# Patient Record
Sex: Female | Born: 1965 | Race: White | Hispanic: No | Marital: Married | Smoking: Current every day smoker
Health system: Southern US, Community
[De-identification: ages and names within clinical notes are randomized; demographics above are authoritative.]

## PROBLEM LIST (undated history)

## (undated) DIAGNOSIS — K219 Gastro-esophageal reflux disease without esophagitis: Secondary | ICD-10-CM

## (undated) DIAGNOSIS — F32A Depression, unspecified: Secondary | ICD-10-CM

## (undated) DIAGNOSIS — F329 Major depressive disorder, single episode, unspecified: Secondary | ICD-10-CM

## (undated) DIAGNOSIS — F988 Other specified behavioral and emotional disorders with onset usually occurring in childhood and adolescence: Secondary | ICD-10-CM

## (undated) DIAGNOSIS — A048 Other specified bacterial intestinal infections: Secondary | ICD-10-CM

## (undated) HISTORY — PX: CHOLECYSTECTOMY: SHX55

## (undated) HISTORY — PX: ABDOMINAL HYSTERECTOMY: SHX81

## (undated) HISTORY — PX: AUGMENTATION MAMMAPLASTY: SUR837

---

## 2008-07-27 ENCOUNTER — Emergency Department (HOSPITAL_BASED_OUTPATIENT_CLINIC_OR_DEPARTMENT_OTHER): Admission: EM | Admit: 2008-07-27 | Discharge: 2008-07-27 | Payer: Self-pay | Admitting: Emergency Medicine

## 2008-07-27 ENCOUNTER — Ambulatory Visit: Payer: Self-pay | Admitting: Diagnostic Radiology

## 2008-09-15 ENCOUNTER — Ambulatory Visit (HOSPITAL_BASED_OUTPATIENT_CLINIC_OR_DEPARTMENT_OTHER): Admission: RE | Admit: 2008-09-15 | Discharge: 2008-09-15 | Payer: Self-pay | Admitting: Orthopedic Surgery

## 2008-09-15 ENCOUNTER — Ambulatory Visit: Payer: Self-pay | Admitting: Diagnostic Radiology

## 2008-10-06 ENCOUNTER — Ambulatory Visit (HOSPITAL_BASED_OUTPATIENT_CLINIC_OR_DEPARTMENT_OTHER): Admission: RE | Admit: 2008-10-06 | Discharge: 2008-10-06 | Payer: Self-pay | Admitting: Orthopedic Surgery

## 2008-10-06 ENCOUNTER — Ambulatory Visit: Payer: Self-pay | Admitting: Diagnostic Radiology

## 2008-10-14 ENCOUNTER — Encounter: Admission: RE | Admit: 2008-10-14 | Discharge: 2009-01-12 | Payer: Self-pay | Admitting: Orthopedic Surgery

## 2009-01-28 ENCOUNTER — Encounter: Admission: RE | Admit: 2009-01-28 | Discharge: 2009-03-16 | Payer: Self-pay | Admitting: Orthopedic Surgery

## 2012-03-14 ENCOUNTER — Emergency Department (HOSPITAL_BASED_OUTPATIENT_CLINIC_OR_DEPARTMENT_OTHER): Payer: BC Managed Care – PPO

## 2012-03-14 ENCOUNTER — Emergency Department (HOSPITAL_BASED_OUTPATIENT_CLINIC_OR_DEPARTMENT_OTHER)
Admission: EM | Admit: 2012-03-14 | Discharge: 2012-03-14 | Disposition: A | Discharge status: Home / Self Care | Payer: BC Managed Care – PPO | Attending: Emergency Medicine | Admitting: Emergency Medicine

## 2012-03-14 ENCOUNTER — Encounter (HOSPITAL_BASED_OUTPATIENT_CLINIC_OR_DEPARTMENT_OTHER): Payer: Self-pay | Admitting: *Deleted

## 2012-03-14 DIAGNOSIS — Z79899 Other long term (current) drug therapy: Secondary | ICD-10-CM | POA: Insufficient documentation

## 2012-03-14 DIAGNOSIS — F329 Major depressive disorder, single episode, unspecified: Secondary | ICD-10-CM | POA: Insufficient documentation

## 2012-03-14 DIAGNOSIS — F3289 Other specified depressive episodes: Secondary | ICD-10-CM | POA: Insufficient documentation

## 2012-03-14 DIAGNOSIS — R110 Nausea: Secondary | ICD-10-CM | POA: Insufficient documentation

## 2012-03-14 DIAGNOSIS — F988 Other specified behavioral and emotional disorders with onset usually occurring in childhood and adolescence: Secondary | ICD-10-CM | POA: Insufficient documentation

## 2012-03-14 DIAGNOSIS — Z87448 Personal history of other diseases of urinary system: Secondary | ICD-10-CM | POA: Insufficient documentation

## 2012-03-14 DIAGNOSIS — F17200 Nicotine dependence, unspecified, uncomplicated: Secondary | ICD-10-CM | POA: Insufficient documentation

## 2012-03-14 DIAGNOSIS — K219 Gastro-esophageal reflux disease without esophagitis: Secondary | ICD-10-CM | POA: Insufficient documentation

## 2012-03-14 DIAGNOSIS — K5900 Constipation, unspecified: Secondary | ICD-10-CM | POA: Insufficient documentation

## 2012-03-14 DIAGNOSIS — M549 Dorsalgia, unspecified: Secondary | ICD-10-CM | POA: Insufficient documentation

## 2012-03-14 HISTORY — DX: Major depressive disorder, single episode, unspecified: F32.9

## 2012-03-14 HISTORY — DX: Other specified bacterial intestinal infections: A04.8

## 2012-03-14 HISTORY — DX: Other specified behavioral and emotional disorders with onset usually occurring in childhood and adolescence: F98.8

## 2012-03-14 HISTORY — DX: Depression, unspecified: F32.A

## 2012-03-14 HISTORY — DX: Gastro-esophageal reflux disease without esophagitis: K21.9

## 2012-03-14 LAB — CBC WITH DIFFERENTIAL/PLATELET
Basophils Absolute: 0 10*3/uL (ref 0.0–0.1)
HCT: 41.5 % (ref 36.0–46.0)
Hemoglobin: 14.3 g/dL (ref 12.0–15.0)
Lymphocytes Relative: 40 % (ref 12–46)
Monocytes Absolute: 0.6 10*3/uL (ref 0.1–1.0)
Monocytes Relative: 8 % (ref 3–12)
Neutro Abs: 4.2 10*3/uL (ref 1.7–7.7)
Neutrophils Relative %: 52 % (ref 43–77)
WBC: 8.1 10*3/uL (ref 4.0–10.5)

## 2012-03-14 LAB — COMPREHENSIVE METABOLIC PANEL
BUN: 7 mg/dL (ref 6–23)
Calcium: 9.5 mg/dL (ref 8.4–10.5)
Creatinine, Ser: 0.6 mg/dL (ref 0.50–1.10)
GFR calc Af Amer: >90 mL/min (ref >90)
GFR calc non Af Amer: >90 mL/min (ref >90)
Glucose, Bld: 85 mg/dL (ref 70–99)
Total Protein: 7.2 g/dL (ref 6.0–8.3)

## 2012-03-14 LAB — URINALYSIS, ROUTINE W REFLEX MICROSCOPIC
Bilirubin Urine: NEGATIVE
Hgb urine dipstick: NEGATIVE
Ketones, ur: NEGATIVE mg/dL
Protein, ur: NEGATIVE mg/dL
Urobilinogen, UA: 0.2 mg/dL (ref 0.0–1.0)

## 2012-03-14 MED ORDER — HYDROCODONE-ACETAMINOPHEN 5-325 MG PO TABS: 2.0000 | ORAL_TABLET | ORAL | Status: AC | PRN

## 2012-03-14 MED ORDER — MORPHINE SULFATE 4 MG/ML IJ SOLN
4.0000 mg | Freq: Once | INTRAMUSCULAR | Status: AC
  Administered 2012-03-14: 4 mg via INTRAVENOUS
  Filled 2012-03-14: qty 1

## 2012-03-14 MED ORDER — ONDANSETRON HCL 4 MG/2ML IJ SOLN
4.0000 mg | Freq: Once | INTRAMUSCULAR | Status: AC
  Administered 2012-03-14: 4 mg via INTRAVENOUS
  Filled 2012-03-14: qty 2

## 2012-03-14 MED ORDER — IOHEXOL 300 MG/ML  SOLN
25.0000 mL | INTRAMUSCULAR | Status: AC
  Administered 2012-03-14 (×2): 25 mL via ORAL

## 2012-03-14 MED ORDER — IOHEXOL 300 MG/ML  SOLN
100.0000 mL | Freq: Once | INTRAMUSCULAR | Status: AC | PRN
  Administered 2012-03-14: 100 mL via INTRAVENOUS

## 2012-03-14 NOTE — ED Notes (Signed)
Pt. Drinking contrast and reports he abd. Is starting to hurt again.  Pt. Was having pain relief.

## 2012-03-14 NOTE — ED Provider Notes (Signed)
History     CSN: [id number]  Arrival date & time 03/14/12  1339   First MD Initiated Contact with Patient 03/14/12 1411      Chief Complaint  Patient presents with  . Abdominal Pain    (Consider location/radiation/quality/duration/timing/severity/associated sxs/prior treatment) HPI Comments: Pt c/o generalized lower abdominal pain:no fever:denies history of similar symptoms  Patient is a 46 y.o. female presenting with abdominal pain. The history is provided by the patient. No language interpreter was used.  Abdominal Pain The primary symptoms of the illness include abdominal pain and nausea. The primary symptoms of the illness do not include vomiting, dysuria or vaginal discharge. The current episode started more than 2 days ago. The onset of the illness was gradual. The problem has not changed since onset. The patient states that she believes she is currently not pregnant. The patient has not had a change in bowel habit. Additional symptoms associated with the illness include back pain. Symptoms associated with the illness do not include chills.    Past Medical History  Diagnosis Date  . Depression   . ADD (attention deficit disorder)   . GERD (gastroesophageal reflux disease)   . H. pylori infection     Past Surgical History  Procedure Date  . Abdominal hysterectomy   . Cholecystectomy     No family history on file.  History  Substance Use Topics  . Smoking status: Current Every Day Smoker -- 0.5 packs/day    Types: Cigarettes  . Smokeless tobacco: Not on file  . Alcohol Use: Yes    OB History    Grav Para Term Preterm Abortions TAB SAB Ect Mult Living                  Review of Systems  Constitutional: Negative for chills.  Respiratory: Negative.   Cardiovascular: Negative.   Gastrointestinal: Positive for nausea and abdominal pain. Negative for vomiting.  Genitourinary: Negative for dysuria and vaginal discharge.  Musculoskeletal: Positive for back  pain.    Allergies  Review of patient's allergies indicates no known allergies.  Home Medications   Current Outpatient Rx  Name  Route  Sig  Dispense  Refill  . XANAX PO   Oral   Take by mouth.         . AMPHETAMINE-DEXTROAMPHETAMINE 10 MG PO TABS   Oral   Take 10 mg by mouth daily.         Marland Kitchen FLUOXETINE HCL 40 MG PO CAPS   Oral   Take 40 mg by mouth daily.         . AMBIEN PO   Oral   Take by mouth.           BP 138/82  Pulse 80  Temp 98.2 F (36.8 C) (Oral)  Resp 22  SpO2 100%  Physical Exam  Nursing note and vitals reviewed. Constitutional: She appears well-developed and well-nourished.  Cardiovascular: Normal rate and regular rhythm.   Pulmonary/Chest: Effort normal and breath sounds normal.  Abdominal: Soft. Bowel sounds are normal. There is tenderness in the left lower quadrant.  Musculoskeletal: Normal range of motion.  Neurological: She is alert.  Skin: Skin is warm and dry.  Psychiatric: She has a normal mood and affect.    ED Course  Procedures (including critical care time)   Labs Reviewed  URINALYSIS, ROUTINE W REFLEX MICROSCOPIC  COMPREHENSIVE METABOLIC PANEL  CBC WITH DIFFERENTIAL  LIPASE, BLOOD   Ct Abdomen Pelvis W Contrast  03/14/2012  *RADIOLOGY  REPORT*  Clinical Data: Abdominal pain.  Nausea.  CT ABDOMEN AND PELVIS WITH CONTRAST  Technique:  Multidetector CT imaging of the abdomen and pelvis was performed following the standard protocol during bolus administration of intravenous contrast.  Contrast: OMNIPAQUE IOHEXOL 300 MG/ML  SOLN  Comparison: None.  Findings: Linear subsegmental atelectasis or scarring noted medially in the right middle lobe.  The common bile duct measures 8 mm in diameter, likely physiologic response to cholecystectomy.  The common hepatic duct measures about 1.3 cm and there is mild intrahepatic bile duct dilatation near the liver hilum and in the left hepatic lobe.  I do not observe a mass in this  vicinity. The pancreas, spleen, and adrenal glands appear normal. No pathologic retroperitoneal or porta hepatis adenopathy is identified.  Prominence of stool throughout the colon suggests constipation.  6 mm hypodense lesion in the left mid kidney is technically too small to characterize although statistically likely to represent a cyst.  Urinary bladder unremarkable. No pathologic pelvic adenopathy is identified.  Right ovarian follicles unremarkable.  Left ovary is somewhat anterior in position but overall unremarkable.  Uterus absent.  There is mild spurring of the femoral heads.  IMPRESSION:  1.  Mild common hepatic duct and intrahepatic duct biliary dilatation is probably a physiologic response cholecystectomy, with an obstructive process considered unlikely. 2.  Small left renal cyst. 3. Prominence of stool throughout the colon suggests constipation.   Original Report Authenticated By: Gaylyn Rong, M.D.      1. Constipation       MDM  Pt in no acute distress at the time:abdomen not acute:pt given instructions on helping with constipation        Teressa Lower, NP 03/14/12 1641

## 2012-03-14 NOTE — ED Notes (Signed)
Pt. Gone at  Present time to CT scan.

## 2012-03-14 NOTE — ED Notes (Signed)
Pt. Up to restroom with no distress noted.  Pt. Will be receiving more pain meds due to c/o abd. Pain and then to CT scan.  Pt. Is alert and oriented.

## 2012-03-14 NOTE — ED Notes (Signed)
Lower abdominal pain with nausea. Denies urinary problems, vomiting or diarrhea.

## 2012-03-15 NOTE — ED Provider Notes (Signed)
Medical screening examination/treatment/procedure(s) were performed by non-physician practitioner and as supervising physician I was immediately available for consultation/collaboration.   Gwyneth Sprout, MD 03/15/12 [phone]

## 2014-01-19 IMAGING — CT CT ABD-PELV W/ CM
2 of 5 series · 16 of 46 positions shown, 18 images · IV contrast (APPLIED)
Comparison: None.

CLINICAL DATA: Abdominal pain.  Nausea.

CT ABDOMEN AND PELVIS WITH CONTRAST
TECHNIQUE: Multidetector CT imaging of the abdomen and pelvis was
performed following the standard protocol during bolus
administration of intravenous contrast.
Contrast: 100mL OMNIPAQUE IOHEXOL 300 MG/ML  SOLN

[Series 2: abd/pelvis 5.0 b31f · axial · 0.68mm/px · z∈[-466,-101]mm · 13 of 83 slices shown, 15 images]
[im 5/83  soft-tissue]
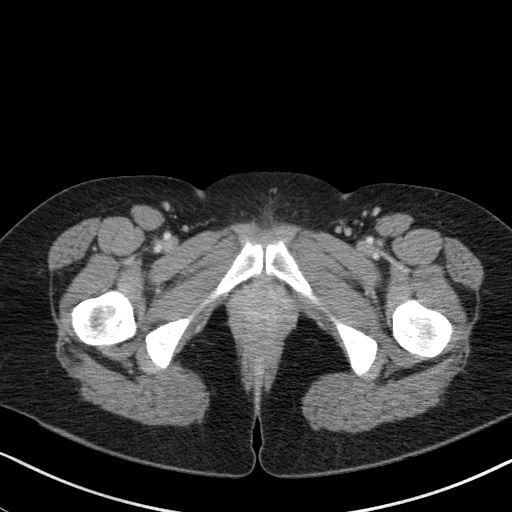
[im 5/83  bone]
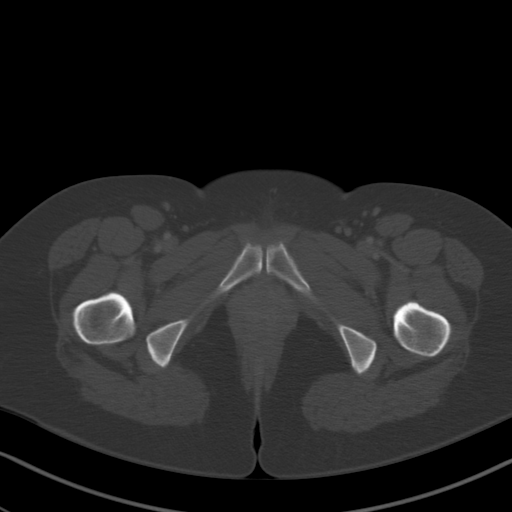
[im 10/83  soft-tissue]
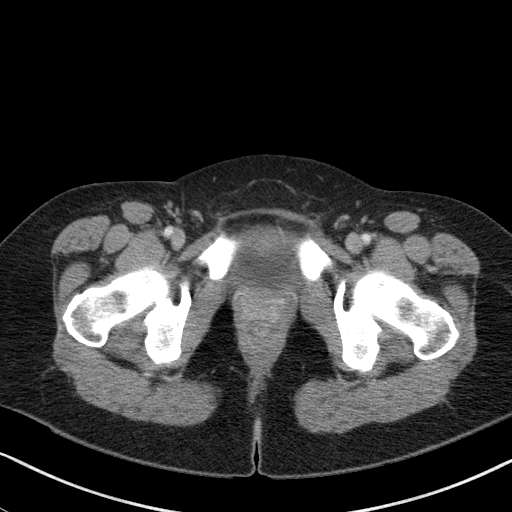
[im 19/83  soft-tissue]
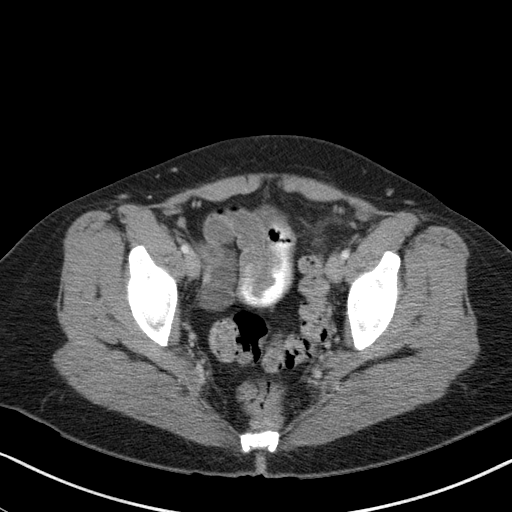
[im 23/83  soft-tissue]
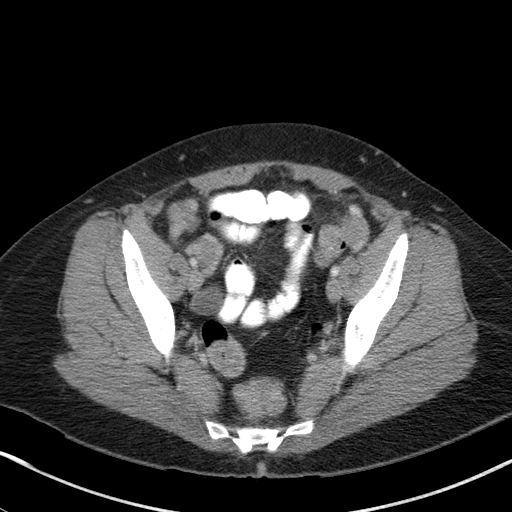
[im 28/83  soft-tissue]
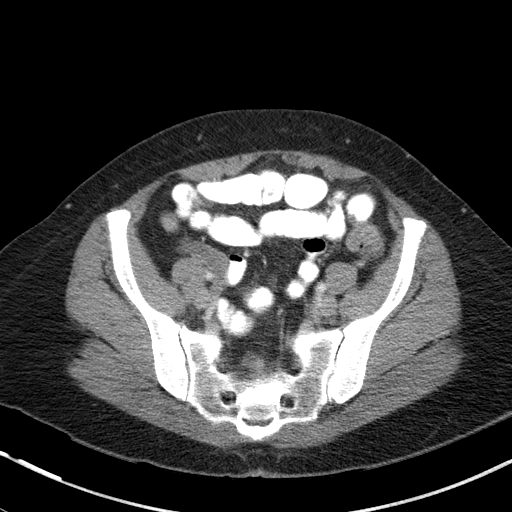
[im 37/83  soft-tissue]
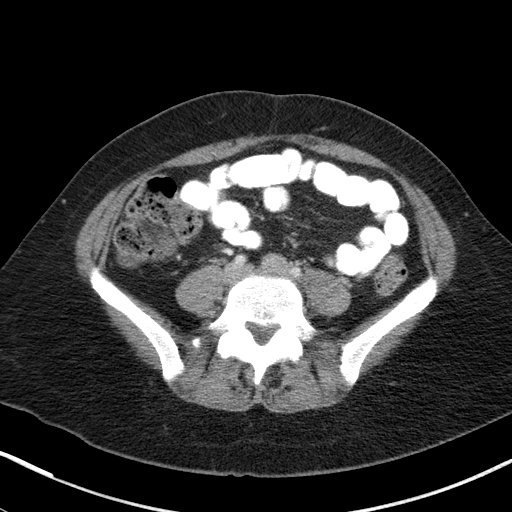
[im 42/83  soft-tissue]
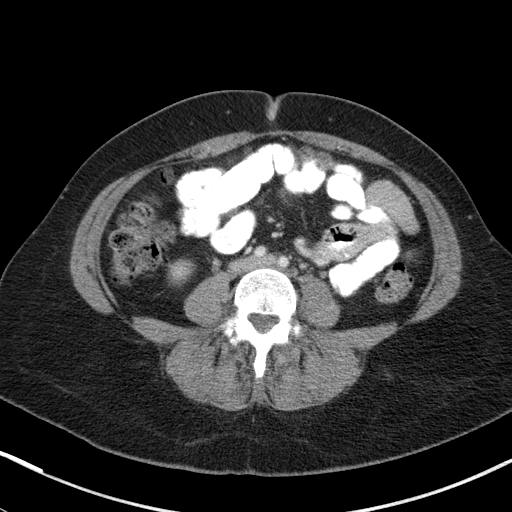
[im 46/83  soft-tissue]
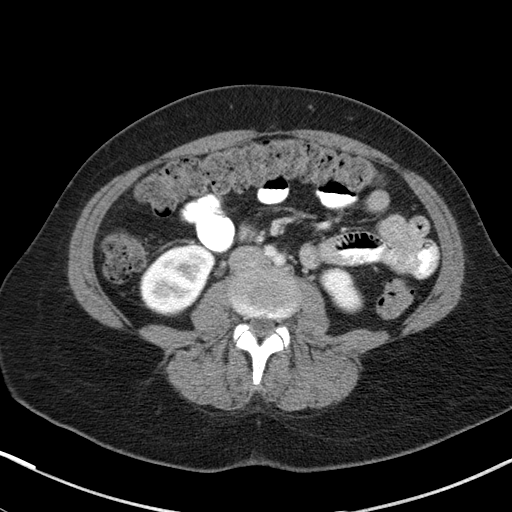
[im 55/83  soft-tissue]
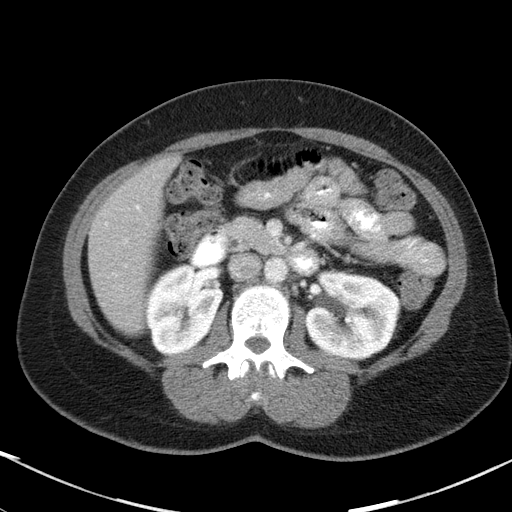
[im 55/83  bone]
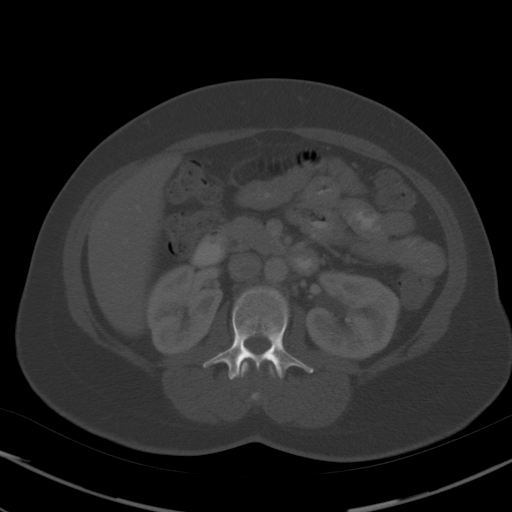
[im 60/83  soft-tissue]
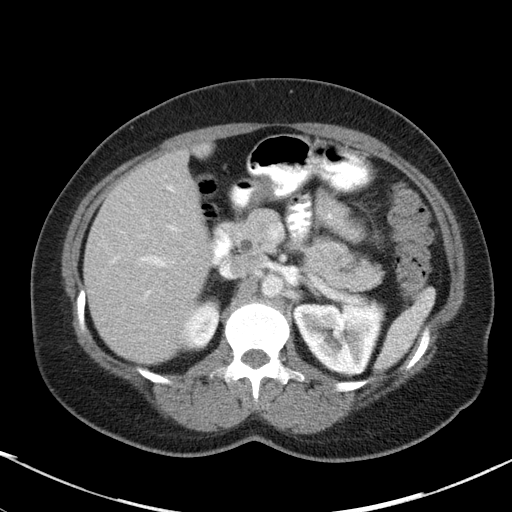
[im 64/83  soft-tissue]
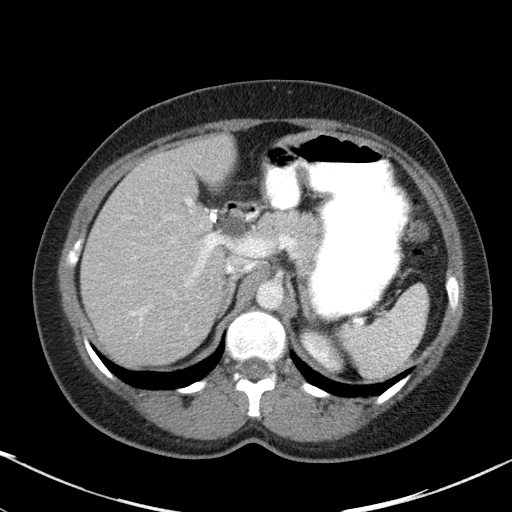
[im 73/83  soft-tissue]
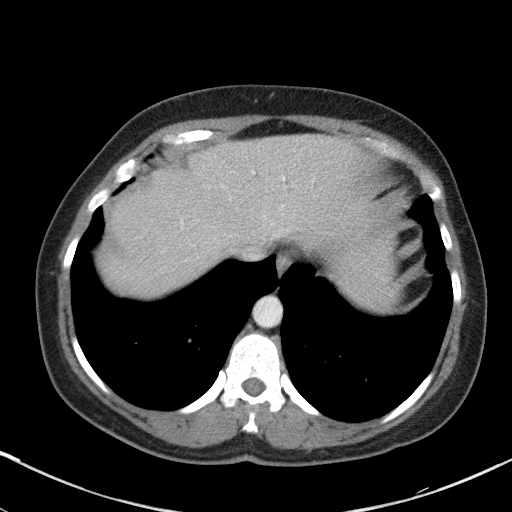
[im 78/83  soft-tissue]
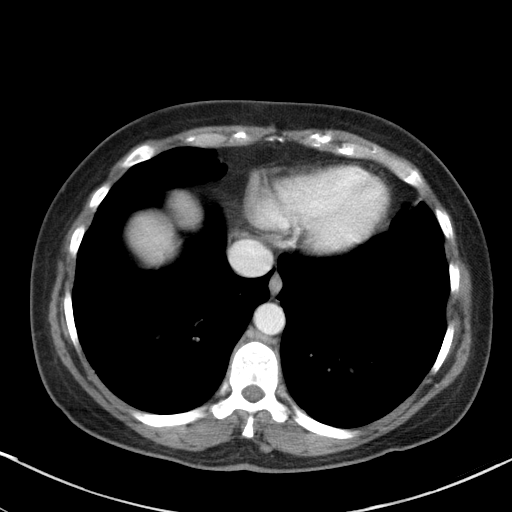

[Series 5: abd/pelvis 3.0 coronal · coronal · 0.74mm/px · 3 of 76 slices shown]
[im 26/76  soft-tissue]
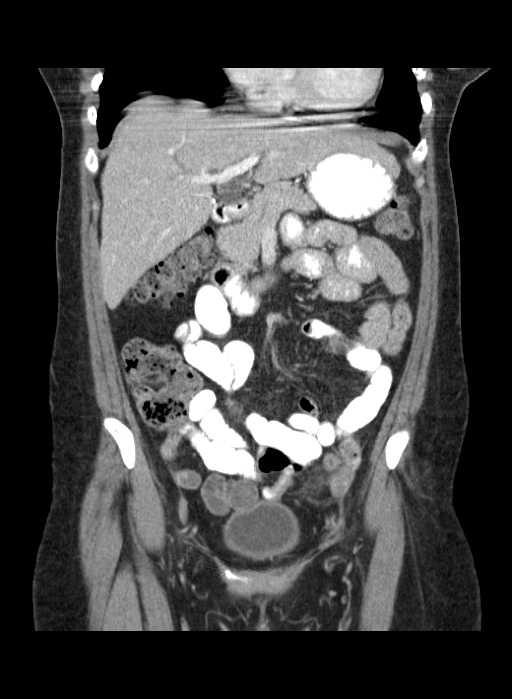
[im 34/76  soft-tissue]
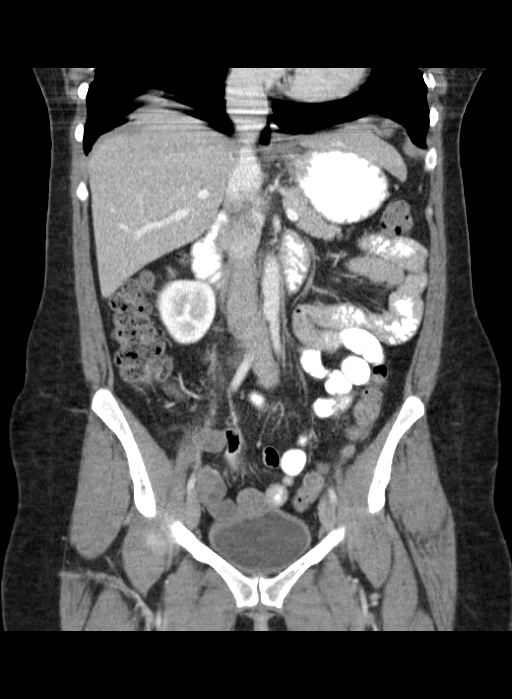
[im 42/76  soft-tissue]
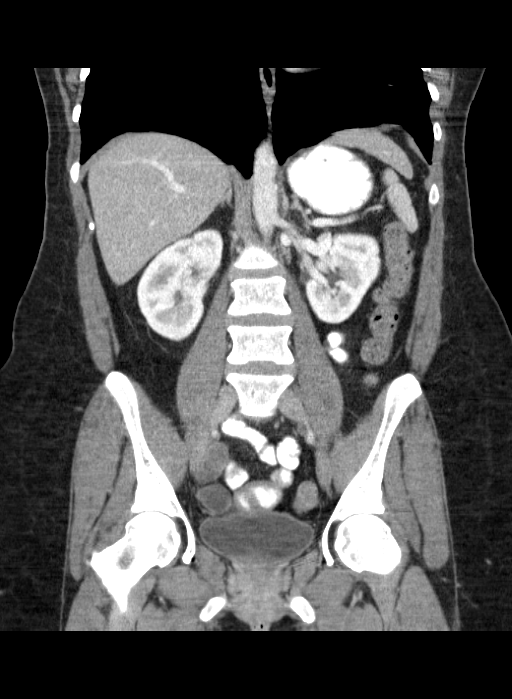

[16 of 46 positions shown; findings below may reference images not displayed]

FINDINGS: Linear subsegmental atelectasis or scarring noted
medially in the right middle lobe.

The common bile duct measures 8 mm in diameter, likely physiologic
response to cholecystectomy.  The common hepatic duct measures
about 1.3 cm and there is mild intrahepatic bile duct dilatation
near the liver hilum and in the left hepatic lobe.  I do not
observe a mass in this vicinity. The pancreas, spleen, and adrenal
glands appear normal. No pathologic retroperitoneal or porta
hepatis adenopathy is identified.

Prominence of stool throughout the colon suggests constipation.  6
mm hypodense lesion in the left mid kidney is technically too small
to characterize although statistically likely to represent a cyst.

Urinary bladder unremarkable. No pathologic pelvic adenopathy is
identified.

Right ovarian follicles unremarkable.  Left ovary is somewhat
anterior in position but overall unremarkable.  Uterus absent.

There is mild spurring of the femoral heads.
IMPRESSION: 1.  Mild common hepatic duct and intrahepatic duct biliary
dilatation is probably a physiologic response cholecystectomy, with
an obstructive process considered unlikely.
2.  Small left renal cyst.
3. Prominence of stool throughout the colon suggests constipation.

## 2017-04-09 ENCOUNTER — Other Ambulatory Visit (HOSPITAL_BASED_OUTPATIENT_CLINIC_OR_DEPARTMENT_OTHER): Payer: Self-pay | Admitting: Physician Assistant

## 2017-04-09 DIAGNOSIS — Z1231 Encounter for screening mammogram for malignant neoplasm of breast: Secondary | ICD-10-CM

## 2017-04-16 ENCOUNTER — Encounter (HOSPITAL_BASED_OUTPATIENT_CLINIC_OR_DEPARTMENT_OTHER): Payer: Self-pay

## 2017-04-16 ENCOUNTER — Other Ambulatory Visit (HOSPITAL_BASED_OUTPATIENT_CLINIC_OR_DEPARTMENT_OTHER): Payer: Self-pay | Admitting: Physician Assistant

## 2017-04-16 ENCOUNTER — Ambulatory Visit (HOSPITAL_BASED_OUTPATIENT_CLINIC_OR_DEPARTMENT_OTHER)
Admission: RE | Admit: 2017-04-16 | Discharge: 2017-04-16 | Disposition: A | Discharge status: Home / Self Care | Payer: BLUE CROSS/BLUE SHIELD | Source: Ambulatory Visit | Attending: Physician Assistant | Admitting: Physician Assistant

## 2017-04-16 DIAGNOSIS — Z1231 Encounter for screening mammogram for malignant neoplasm of breast: Secondary | ICD-10-CM | POA: Insufficient documentation

## 2017-04-18 ENCOUNTER — Other Ambulatory Visit: Payer: Self-pay | Admitting: Physician Assistant

## 2017-04-18 DIAGNOSIS — R928 Other abnormal and inconclusive findings on diagnostic imaging of breast: Secondary | ICD-10-CM

## 2017-06-04 ENCOUNTER — Other Ambulatory Visit: Payer: BLUE CROSS/BLUE SHIELD

## 2017-12-27 ENCOUNTER — Other Ambulatory Visit (HOSPITAL_BASED_OUTPATIENT_CLINIC_OR_DEPARTMENT_OTHER): Payer: Self-pay | Admitting: Emergency Medicine

## 2017-12-27 DIAGNOSIS — R109 Unspecified abdominal pain: Secondary | ICD-10-CM

## 2017-12-29 ENCOUNTER — Ambulatory Visit (HOSPITAL_BASED_OUTPATIENT_CLINIC_OR_DEPARTMENT_OTHER)
Admission: RE | Admit: 2017-12-29 | Discharge: 2017-12-29 | Disposition: A | Discharge status: Home / Self Care | Payer: BLUE CROSS/BLUE SHIELD | Source: Ambulatory Visit | Attending: Emergency Medicine | Admitting: Emergency Medicine

## 2017-12-29 DIAGNOSIS — Z9889 Other specified postprocedural states: Secondary | ICD-10-CM | POA: Insufficient documentation

## 2017-12-29 DIAGNOSIS — Z9049 Acquired absence of other specified parts of digestive tract: Secondary | ICD-10-CM | POA: Diagnosis not present

## 2017-12-29 DIAGNOSIS — R1030 Lower abdominal pain, unspecified: Secondary | ICD-10-CM | POA: Insufficient documentation

## 2017-12-29 DIAGNOSIS — R109 Unspecified abdominal pain: Secondary | ICD-10-CM | POA: Diagnosis not present

## 2017-12-31 ENCOUNTER — Ambulatory Visit (HOSPITAL_BASED_OUTPATIENT_CLINIC_OR_DEPARTMENT_OTHER): Payer: BLUE CROSS/BLUE SHIELD

## 2018-04-04 ENCOUNTER — Encounter (HOSPITAL_BASED_OUTPATIENT_CLINIC_OR_DEPARTMENT_OTHER): Payer: Self-pay | Admitting: *Deleted

## 2018-04-04 ENCOUNTER — Other Ambulatory Visit: Payer: Self-pay

## 2018-04-04 ENCOUNTER — Emergency Department (HOSPITAL_BASED_OUTPATIENT_CLINIC_OR_DEPARTMENT_OTHER)
Admission: EM | Admit: 2018-04-04 | Discharge: 2018-04-04 | Disposition: A | Discharge status: Home / Self Care | Payer: BLUE CROSS/BLUE SHIELD | Attending: Emergency Medicine | Admitting: Emergency Medicine

## 2018-04-04 DIAGNOSIS — Z9049 Acquired absence of other specified parts of digestive tract: Secondary | ICD-10-CM | POA: Diagnosis not present

## 2018-04-04 DIAGNOSIS — D696 Thrombocytopenia, unspecified: Secondary | ICD-10-CM | POA: Diagnosis present

## 2018-04-04 DIAGNOSIS — Z0189 Encounter for other specified special examinations: Secondary | ICD-10-CM | POA: Insufficient documentation

## 2018-04-04 DIAGNOSIS — F329 Major depressive disorder, single episode, unspecified: Secondary | ICD-10-CM | POA: Diagnosis not present

## 2018-04-04 DIAGNOSIS — Z79899 Other long term (current) drug therapy: Secondary | ICD-10-CM | POA: Insufficient documentation

## 2018-04-04 DIAGNOSIS — F17210 Nicotine dependence, cigarettes, uncomplicated: Secondary | ICD-10-CM | POA: Insufficient documentation

## 2018-04-04 DIAGNOSIS — F909 Attention-deficit hyperactivity disorder, unspecified type: Secondary | ICD-10-CM | POA: Insufficient documentation

## 2018-04-04 LAB — CBC WITH DIFFERENTIAL/PLATELET
Abs Immature Granulocytes: 0.02 10*3/uL (ref 0.00–0.07)
Basophils Absolute: 0 10*3/uL (ref 0.0–0.1)
Basophils Relative: 0 %
EOS ABS: 0.1 10*3/uL (ref 0.0–0.5)
Eosinophils Relative: 1 %
HCT: 40.8 % (ref 36.0–46.0)
Hemoglobin: 12.9 g/dL (ref 12.0–15.0)
IMMATURE GRANULOCYTES: 0 %
LYMPHS ABS: 4.5 10*3/uL — AB (ref 0.7–4.0)
Lymphocytes Relative: 46 %
MCH: 27.7 pg (ref 26.0–34.0)
MCHC: 31.6 g/dL (ref 30.0–36.0)
MCV: 87.7 fL (ref 80.0–100.0)
Monocytes Absolute: 0.8 10*3/uL (ref 0.1–1.0)
Monocytes Relative: 8 %
NEUTROS PCT: 45 %
NRBC: 0 % (ref 0.0–0.2)
Neutro Abs: 4.4 10*3/uL (ref 1.7–7.7)
PLATELETS: 228 10*3/uL (ref 150–400)
RBC: 4.65 MIL/uL (ref 3.87–5.11)
RDW: 15.4 % (ref 11.5–15.5)
WBC: 9.8 10*3/uL (ref 4.0–10.5)

## 2018-04-04 NOTE — ED Triage Notes (Signed)
Her MD called her and told her to come to the ED due to abnormal lab work. She had blood drawn 2 days ago.

## 2018-04-04 NOTE — Discharge Instructions (Addendum)
Your blood test was completely normal today.  No signs of abnormal platelet count.  Follow  up with your doctor as planned.

## 2018-04-04 NOTE — ED Provider Notes (Signed)
[facility] EMERGENCY DEPARTMENT Provider Note   CSN: [id number] Arrival date & time: 04/04/18  1556     History   Chief Complaint Chief Complaint  Patient presents with  . Abnormal Lab    HPI Jody Harper is a 53 y.o. female.  HPI Patient presents to the emergency room for evaluation of an abnormal laboratory test.  Patient states she had routine blood test at her doctor's office the other day.  Patient states she felt fine this was a routine checkup.  She has not had any issues with fevers or chills.  No bleeding.  No bruising.  She was called today and was told that her platelet count was very low, 20,000.  Patient thinks there may been some issues when the blood was drawn.  She did note that the patient had difficulty drawing her blood.  She thought the blood might have been clotted. Past Medical History:  Diagnosis Date  . ADD (attention deficit disorder)   . Depression   . GERD (gastroesophageal reflux disease)   . H. pylori infection     There are no active problems to display for this patient.   Past Surgical History:  Procedure Laterality Date  . ABDOMINAL HYSTERECTOMY    . AUGMENTATION MAMMAPLASTY Bilateral   . CHOLECYSTECTOMY       OB History   No obstetric history on file.      Home Medications    Prior to Admission medications   Medication Sig Start Date End Date Taking? Authorizing Provider  FLUoxetine (PROZAC) 40 MG capsule Take 40 mg by mouth daily.   Yes [provider]  metoprolol tartrate (LOPRESSOR) 25 MG tablet Take by mouth. 12/11/17  Yes [provider]  temazepam (RESTORIL) 30 MG capsule Take by mouth. 10/02/17  Yes [provider]  ALPRAZolam (XANAX PO) Take by mouth.    [provider]  amphetamine-dextroamphetamine (ADDERALL) 10 MG tablet Take 10 mg by mouth daily.    [provider]  dexlansoprazole (DEXILANT) 60 MG capsule Take by mouth.    [provider]    HYDROcodone-acetaminophen (NORCO/VICODIN) 5-325 MG per tablet Take 2 tablets by mouth every 4 (four) hours as needed for pain. 03/14/12   Teressa Lower, NP  lisdexamfetamine (VYVANSE) 30 MG capsule Take by mouth.    [provider]  valACYclovir (VALTREX) 500 MG tablet Take by mouth.    [provider]  Zolpidem Tartrate (AMBIEN PO) Take by mouth.    [provider]    Family History No family history on file.  Social History Social History   Tobacco Use  . Smoking status: Current Every Day Smoker    Packs/day: 0.50    Types: Cigarettes  . Smokeless tobacco: Never Used  Substance Use Topics  . Alcohol use: Not Currently  . Drug use: No     Allergies   Patient has no known allergies.   Review of Systems Review of Systems  All other systems reviewed and are negative.    Physical Exam Updated Vital Signs BP (!) 123/91   Pulse 92   Temp 98.1 F (36.7 C) (Oral)   Resp 18   Ht 1.626 m (5\' 4" )   Wt 81.6 kg   SpO2 97%   BMI 30.90 kg/m   Physical Exam Vitals signs and nursing note reviewed.  Constitutional:      General: She is not in acute distress.    Appearance: She is well-developed.  HENT:  Head: Normocephalic and atraumatic.     Right Ear: External ear normal.     Left Ear: External ear normal.  Eyes:     General: No scleral icterus.       Right eye: No discharge.        Left eye: No discharge.     Conjunctiva/sclera: Conjunctivae normal.  Neck:     Musculoskeletal: Neck supple.     Trachea: No tracheal deviation.  Cardiovascular:     Rate and Rhythm: Normal rate and regular rhythm.  Pulmonary:     Effort: Pulmonary effort is normal. No respiratory distress.     Breath sounds: Normal breath sounds. No stridor.  Abdominal:     General: There is no distension.  Musculoskeletal:        General: No swelling or deformity.  Skin:    General: Skin is warm and dry.     Findings: No rash.  Neurological:     Mental  Status: She is alert.     Cranial Nerves: Cranial nerve deficit: no gross deficits.      ED Treatments / Results  Labs (all labs ordered are listed, but only abnormal results are displayed) Labs Reviewed  CBC WITH DIFFERENTIAL/PLATELET - Abnormal; Notable for the following components:      Result Value   Lymphs Abs 4.5 (*)    All other components within normal limits    EKG None  Radiology No results found.  Procedures Procedures (including critical care time)  Medications Ordered in ED Medications - No data to display   Initial Impression / Assessment and Plan / ED Course  I have reviewed the triage vital signs and the nursing notes.  Pertinent labs & imaging results that were available during my care of the patient were reviewed by me and considered in my medical decision making (see chart for details).    Patient presented to the emergency room to have her platelet count rechecked.  She was told that it was abnormally low.  It sounds like the specimen most likely clotted.  Today's CBC is entirely normal.  Patient was reassured.  She can follow-up with her doctor routinely  Final Clinical Impressions(s) / ED Diagnoses   Final diagnoses:  Visit for blood test    ED Discharge Orders    None       Linwood Dibbles, MD 04/04/18 1701

## 2020-08-19 IMAGING — US US ABDOMEN COMPLETE
1 series · 14 of 25 positions shown · non-contrast
Comparison: CT 03/14/2012

CLINICAL DATA: Periumbilical pain for several months. Prior tummy
tuck

EXAM:
ABDOMEN ULTRASOUND COMPLETE

[Series 1: us abdomen complete · 0.20mm/px · 14 of 60 slices shown]
[im 1/60]
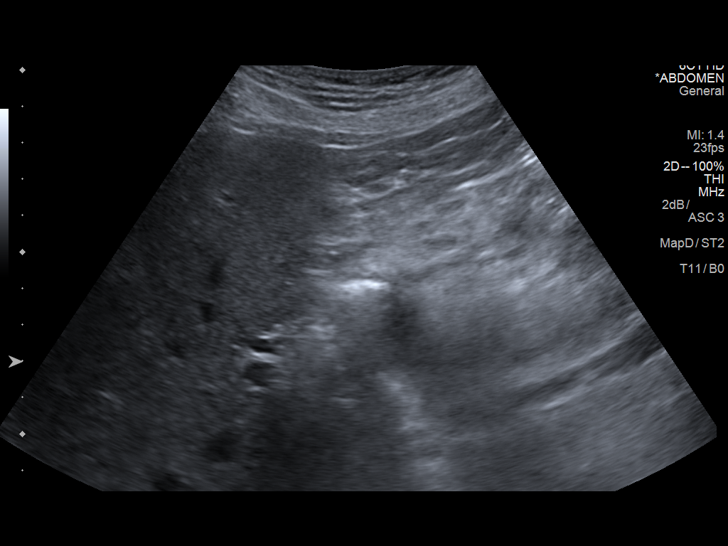
[im 5/60]
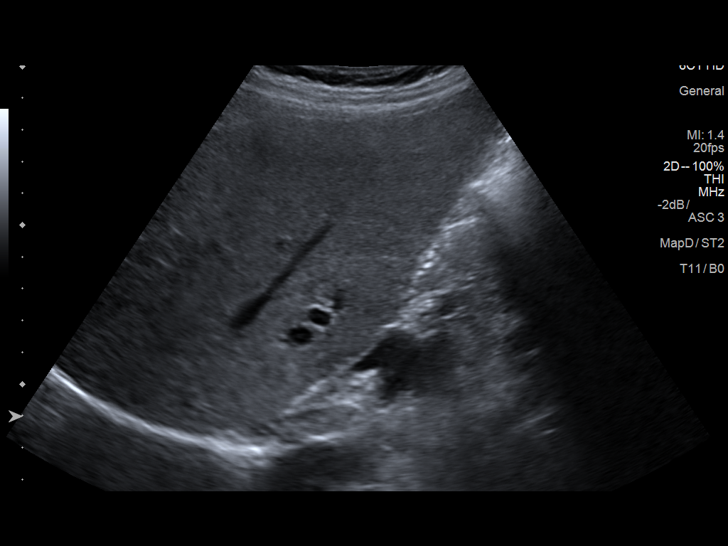
[im 10/60]
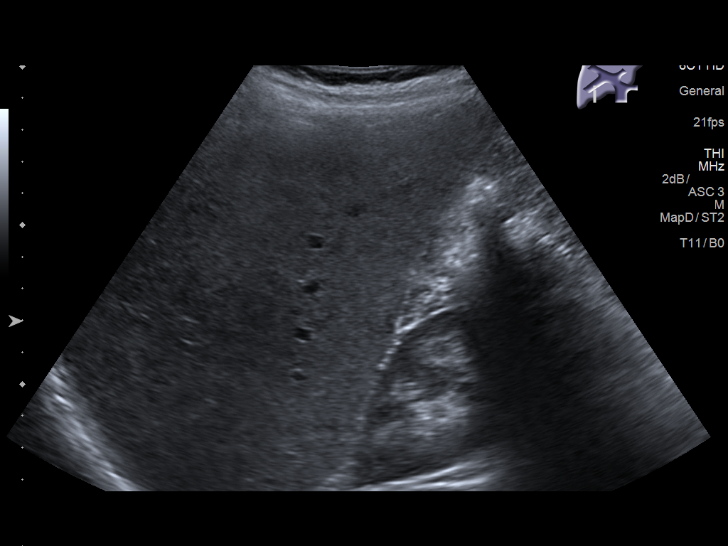
[im 15/60]
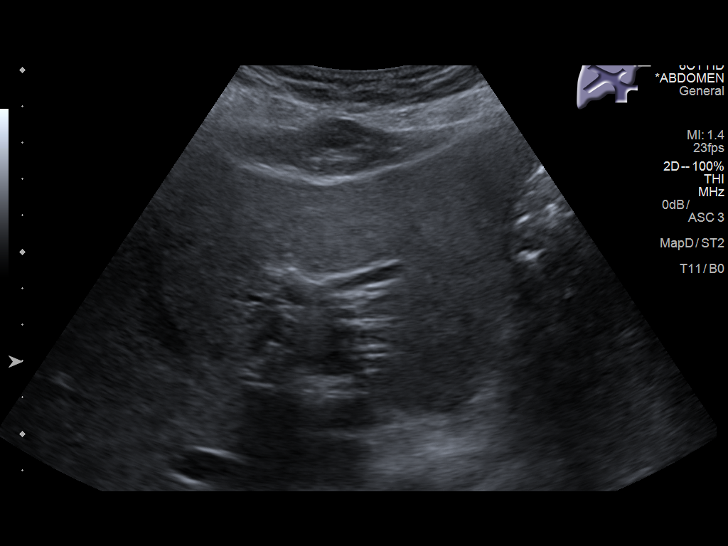
[im 20/60]
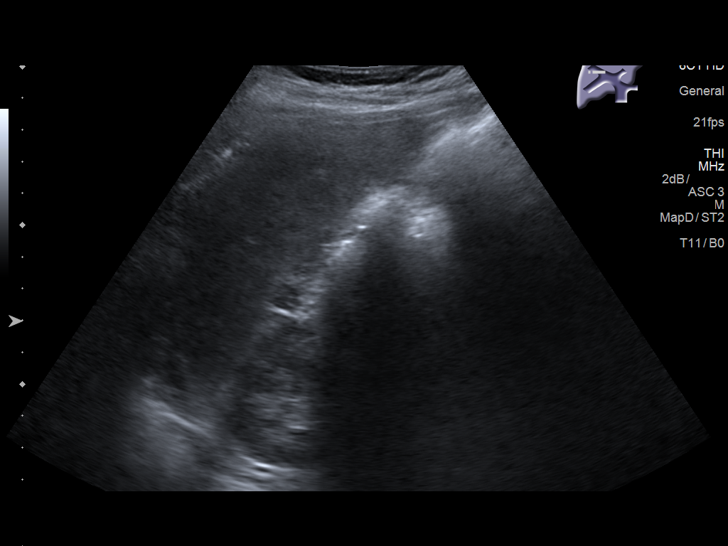
[im 23/60]
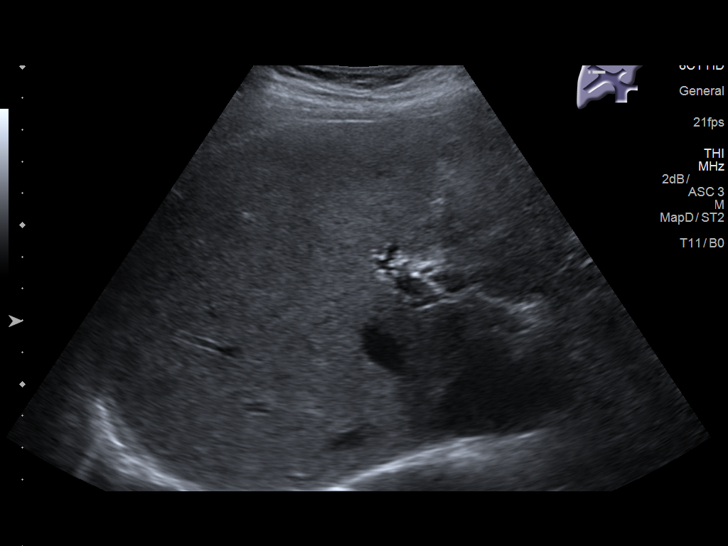
[im 28/60]
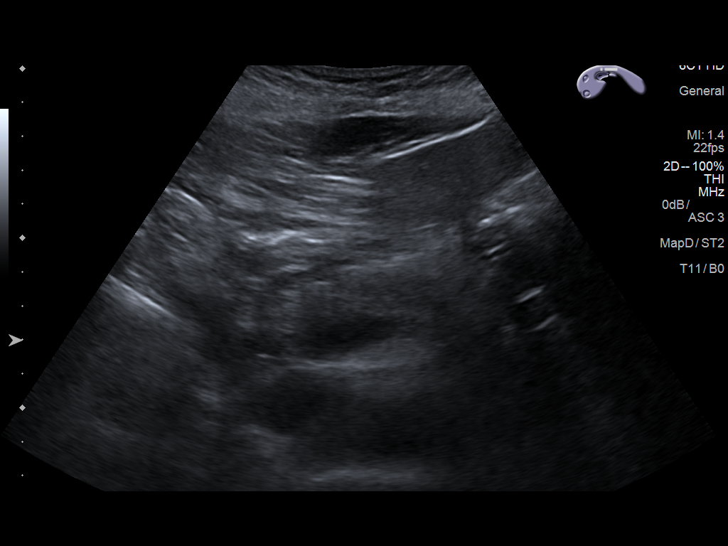
[im 32/60]
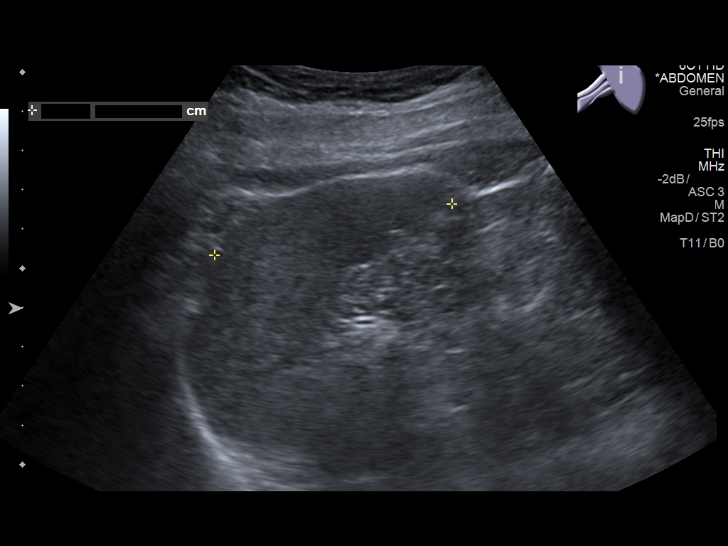
[im 37/60]
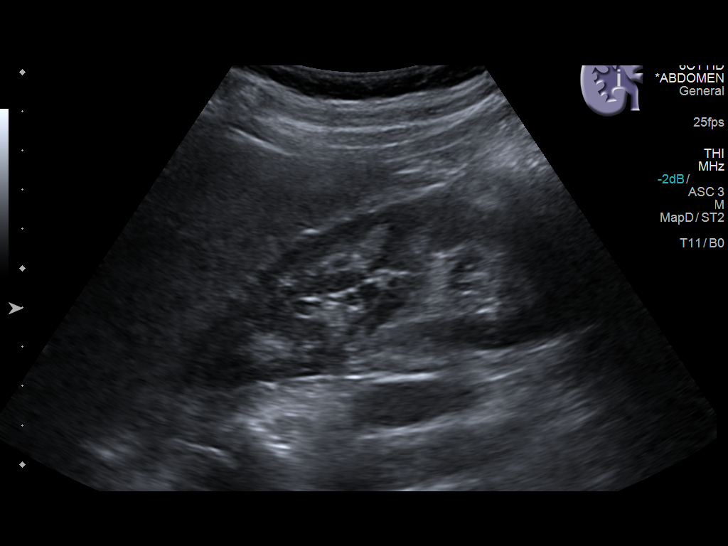
[im 40/60]
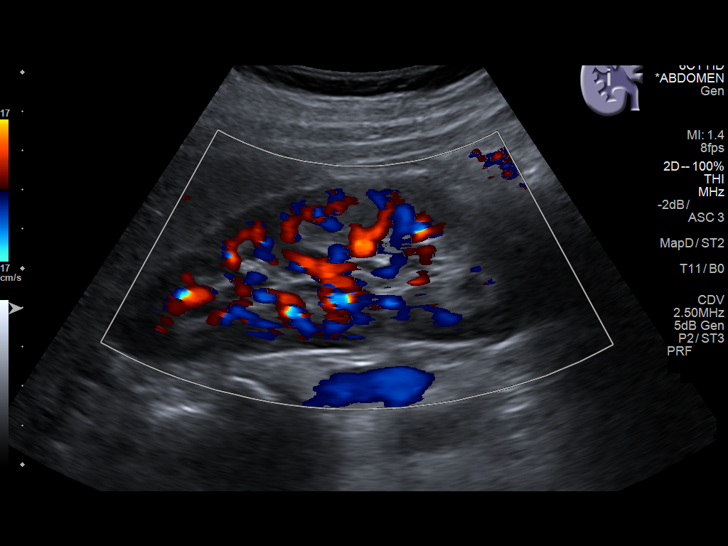
[im 45/60]
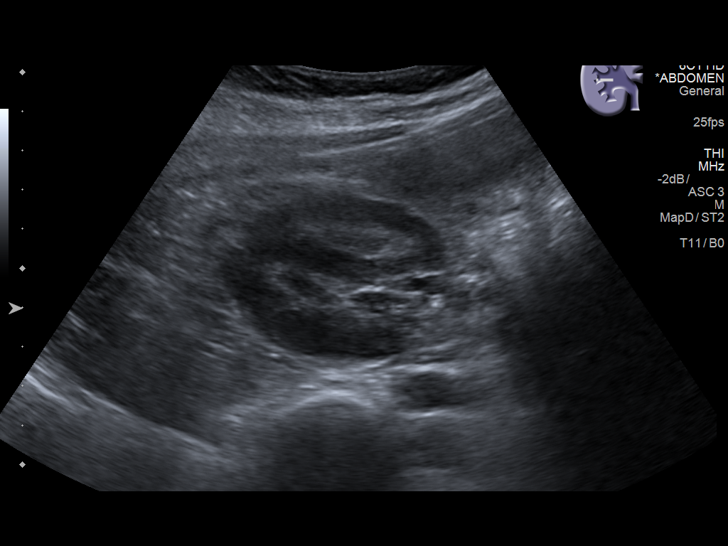
[im 50/60]
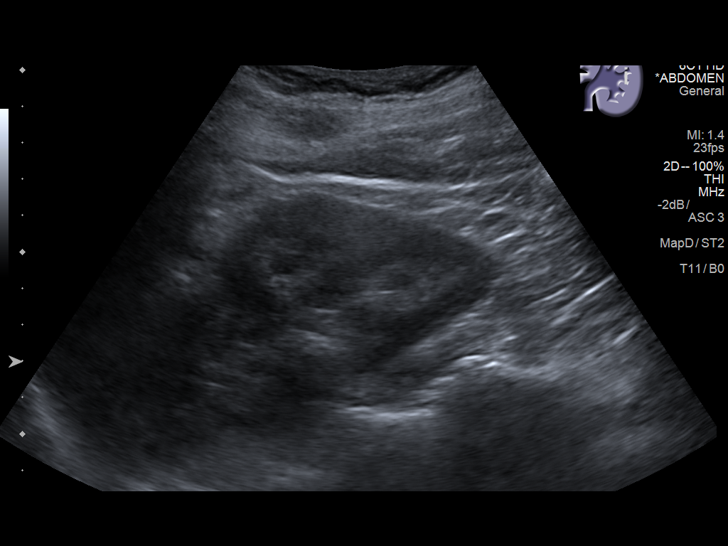
[im 55/60]
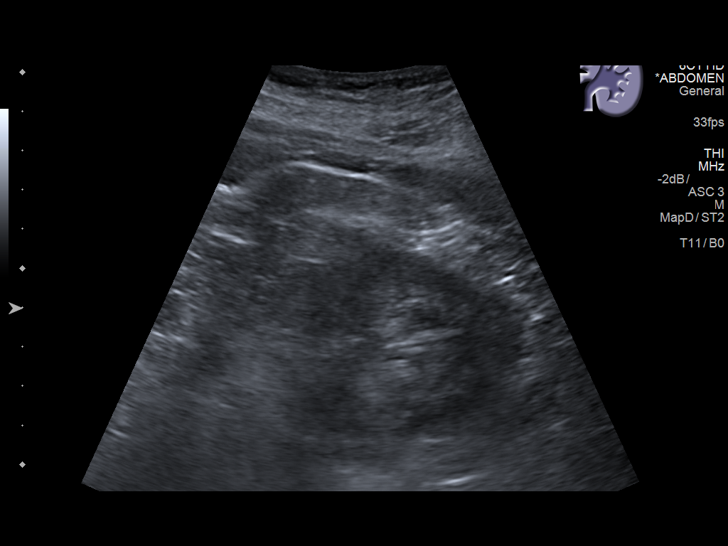
[im 60/60]
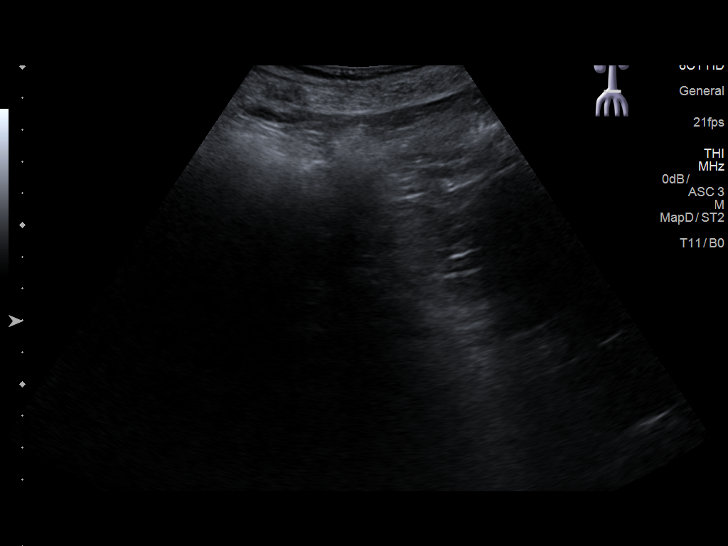

[14 of 25 positions shown; findings below may reference images not displayed]

FINDINGS: Gallbladder: Prior cholecystectomy

Common bile duct: Diameter: Normal caliber, 5 mm

Liver: No focal lesion identified. Within normal limits in
parenchymal echogenicity. Portal vein is patent on color Doppler
imaging with normal direction of blood flow towards the liver.

IVC: No abnormality visualized.

Pancreas: Visualized portion unremarkable.

Spleen: Size and appearance within normal limits.

Right Kidney: Length: 11.4 cm. Echogenicity within normal limits. No
mass or hydronephrosis visualized.

Left Kidney: Length: 11.5 cm. Echogenicity within normal limits. No
mass or hydronephrosis visualized.

Abdominal aorta: No aneurysm visualized.

Other findings: None.
IMPRESSION: Prior cholecystectomy.  No acute findings.
